# Patient Record
Sex: Female | Born: 1935 | Race: White | Hispanic: No | Smoking: Former smoker
Health system: Southern US, Community
[De-identification: ages and names within clinical notes are randomized; demographics above are authoritative.]

## PROBLEM LIST (undated history)

## (undated) HISTORY — PX: JOINT REPLACEMENT: SHX530

---

## 2011-06-21 DIAGNOSIS — K219 Gastro-esophageal reflux disease without esophagitis: Secondary | ICD-10-CM | POA: Insufficient documentation

## 2011-06-21 DIAGNOSIS — Z9049 Acquired absence of other specified parts of digestive tract: Secondary | ICD-10-CM | POA: Insufficient documentation

## 2011-06-21 DIAGNOSIS — E785 Hyperlipidemia, unspecified: Secondary | ICD-10-CM | POA: Insufficient documentation

## 2011-06-21 DIAGNOSIS — B9681 Helicobacter pylori [H. pylori] as the cause of diseases classified elsewhere: Secondary | ICD-10-CM | POA: Insufficient documentation

## 2011-06-21 DIAGNOSIS — D500 Iron deficiency anemia secondary to blood loss (chronic): Secondary | ICD-10-CM | POA: Insufficient documentation

## 2011-06-21 DIAGNOSIS — Z9889 Other specified postprocedural states: Secondary | ICD-10-CM | POA: Insufficient documentation

## 2011-10-18 DIAGNOSIS — Q792 Exomphalos: Secondary | ICD-10-CM | POA: Insufficient documentation

## 2012-08-15 DIAGNOSIS — I10 Essential (primary) hypertension: Secondary | ICD-10-CM | POA: Insufficient documentation

## 2014-06-17 ENCOUNTER — Ambulatory Visit: Payer: Self-pay | Admitting: Physical Therapy

## 2014-06-20 ENCOUNTER — Ambulatory Visit: Payer: Medicare Other | Attending: Physician Assistant | Admitting: Physical Therapy

## 2014-06-20 DIAGNOSIS — R293 Abnormal posture: Secondary | ICD-10-CM | POA: Diagnosis not present

## 2014-06-20 DIAGNOSIS — IMO0001 Reserved for inherently not codable concepts without codable children: Secondary | ICD-10-CM | POA: Diagnosis present

## 2014-06-20 DIAGNOSIS — Z96649 Presence of unspecified artificial hip joint: Secondary | ICD-10-CM | POA: Insufficient documentation

## 2014-06-20 DIAGNOSIS — M545 Low back pain, unspecified: Secondary | ICD-10-CM | POA: Diagnosis not present

## 2014-06-20 DIAGNOSIS — R5381 Other malaise: Secondary | ICD-10-CM | POA: Insufficient documentation

## 2014-06-24 ENCOUNTER — Ambulatory Visit: Payer: Medicare Other | Admitting: Physical Therapy

## 2014-06-24 DIAGNOSIS — IMO0001 Reserved for inherently not codable concepts without codable children: Secondary | ICD-10-CM | POA: Diagnosis not present

## 2014-06-26 ENCOUNTER — Ambulatory Visit: Payer: Medicare Other | Admitting: Physical Therapy

## 2014-06-26 DIAGNOSIS — IMO0001 Reserved for inherently not codable concepts without codable children: Secondary | ICD-10-CM | POA: Diagnosis not present

## 2014-07-01 ENCOUNTER — Ambulatory Visit: Payer: Medicare Other | Attending: Physician Assistant | Admitting: Physical Therapy

## 2014-07-01 DIAGNOSIS — R5381 Other malaise: Secondary | ICD-10-CM | POA: Insufficient documentation

## 2014-07-01 DIAGNOSIS — Z5189 Encounter for other specified aftercare: Secondary | ICD-10-CM | POA: Insufficient documentation

## 2014-07-01 DIAGNOSIS — M545 Low back pain: Secondary | ICD-10-CM | POA: Insufficient documentation

## 2014-07-01 DIAGNOSIS — R293 Abnormal posture: Secondary | ICD-10-CM | POA: Insufficient documentation

## 2014-07-01 DIAGNOSIS — Z96649 Presence of unspecified artificial hip joint: Secondary | ICD-10-CM | POA: Insufficient documentation

## 2014-07-09 ENCOUNTER — Ambulatory Visit: Payer: Medicare Other | Admitting: Physical Therapy

## 2014-07-09 DIAGNOSIS — Z96649 Presence of unspecified artificial hip joint: Secondary | ICD-10-CM | POA: Diagnosis not present

## 2014-07-09 DIAGNOSIS — Z5189 Encounter for other specified aftercare: Secondary | ICD-10-CM | POA: Diagnosis present

## 2014-07-09 DIAGNOSIS — R5381 Other malaise: Secondary | ICD-10-CM | POA: Diagnosis not present

## 2014-07-09 DIAGNOSIS — M545 Low back pain: Secondary | ICD-10-CM | POA: Diagnosis not present

## 2014-07-09 DIAGNOSIS — R293 Abnormal posture: Secondary | ICD-10-CM | POA: Diagnosis not present

## 2014-07-15 ENCOUNTER — Ambulatory Visit: Payer: Medicare Other | Admitting: Physical Therapy

## 2014-07-15 DIAGNOSIS — Z5189 Encounter for other specified aftercare: Secondary | ICD-10-CM | POA: Diagnosis not present

## 2014-07-22 ENCOUNTER — Ambulatory Visit: Payer: Medicare Other | Admitting: Physical Therapy

## 2014-07-22 DIAGNOSIS — Z5189 Encounter for other specified aftercare: Secondary | ICD-10-CM | POA: Diagnosis not present

## 2014-07-29 ENCOUNTER — Ambulatory Visit: Payer: Medicare Other | Attending: Physician Assistant | Admitting: Physical Therapy

## 2014-07-29 DIAGNOSIS — R5381 Other malaise: Secondary | ICD-10-CM | POA: Diagnosis not present

## 2014-07-29 DIAGNOSIS — Z96649 Presence of unspecified artificial hip joint: Secondary | ICD-10-CM | POA: Diagnosis not present

## 2014-07-29 DIAGNOSIS — Z5189 Encounter for other specified aftercare: Secondary | ICD-10-CM | POA: Diagnosis present

## 2014-07-29 DIAGNOSIS — M545 Low back pain: Secondary | ICD-10-CM | POA: Diagnosis not present

## 2014-07-29 DIAGNOSIS — R293 Abnormal posture: Secondary | ICD-10-CM | POA: Insufficient documentation

## 2014-08-05 ENCOUNTER — Encounter: Payer: Private Health Insurance - Indemnity | Admitting: Physical Therapy

## 2015-01-21 DIAGNOSIS — L03116 Cellulitis of left lower limb: Secondary | ICD-10-CM | POA: Insufficient documentation

## 2015-01-21 DIAGNOSIS — F418 Other specified anxiety disorders: Secondary | ICD-10-CM | POA: Insufficient documentation

## 2015-01-21 DIAGNOSIS — R600 Localized edema: Secondary | ICD-10-CM | POA: Insufficient documentation

## 2016-10-04 DIAGNOSIS — N184 Chronic kidney disease, stage 4 (severe): Secondary | ICD-10-CM | POA: Insufficient documentation

## 2016-10-11 DIAGNOSIS — S22000A Wedge compression fracture of unspecified thoracic vertebra, initial encounter for closed fracture: Secondary | ICD-10-CM | POA: Insufficient documentation

## 2017-12-08 ENCOUNTER — Other Ambulatory Visit: Payer: Self-pay

## 2017-12-08 ENCOUNTER — Emergency Department (HOSPITAL_COMMUNITY): Payer: Medicare Other

## 2017-12-08 ENCOUNTER — Encounter (HOSPITAL_COMMUNITY): Payer: Self-pay

## 2017-12-08 ENCOUNTER — Emergency Department (HOSPITAL_COMMUNITY)
Admission: EM | Admit: 2017-12-08 | Discharge: 2017-12-08 | Disposition: A | Discharge status: Home / Self Care | Payer: Medicare Other | Attending: Emergency Medicine | Admitting: Emergency Medicine

## 2017-12-08 DIAGNOSIS — R079 Chest pain, unspecified: Secondary | ICD-10-CM | POA: Diagnosis present

## 2017-12-08 LAB — CBC WITH DIFFERENTIAL/PLATELET
Basophils Absolute: 0 10*3/uL (ref 0.0–0.1)
Basophils Relative: 0 %
EOS PCT: 2 %
Eosinophils Absolute: 0.1 10*3/uL (ref 0.0–0.7)
HEMATOCRIT: 41 % (ref 36.0–46.0)
Hemoglobin: 13.3 g/dL (ref 12.0–15.0)
LYMPHS ABS: 1.1 10*3/uL (ref 0.7–4.0)
LYMPHS PCT: 18 %
MCH: 31.8 pg (ref 26.0–34.0)
MCHC: 32.4 g/dL (ref 30.0–36.0)
MCV: 98.1 fL (ref 78.0–100.0)
MONO ABS: 0.4 10*3/uL (ref 0.1–1.0)
MONOS PCT: 7 %
NEUTROS ABS: 4.8 10*3/uL (ref 1.7–7.7)
Neutrophils Relative %: 73 %
PLATELETS: 219 10*3/uL (ref 150–400)
RBC: 4.18 MIL/uL (ref 3.87–5.11)
RDW: 13.3 % (ref 11.5–15.5)
WBC: 6.5 10*3/uL (ref 4.0–10.5)

## 2017-12-08 LAB — BASIC METABOLIC PANEL
ANION GAP: 13 (ref 5–15)
BUN: 22 mg/dL — AB (ref 6–20)
CALCIUM: 9.7 mg/dL (ref 8.9–10.3)
CO2: 28 mmol/L (ref 22–32)
CREATININE: 1.34 mg/dL — AB (ref 0.44–1.00)
Chloride: 97 mmol/L — ABNORMAL LOW (ref 101–111)
GFR calc Af Amer: 41 mL/min — ABNORMAL LOW (ref >60)
GFR calc non Af Amer: 35 mL/min — ABNORMAL LOW (ref >60)
GLUCOSE: 97 mg/dL (ref 65–99)
Potassium: 4.2 mmol/L (ref 3.5–5.1)
Sodium: 138 mmol/L (ref 135–145)

## 2017-12-08 LAB — I-STAT TROPONIN, ED: Troponin i, poc: 0 ng/mL (ref 0.00–0.08)

## 2017-12-08 NOTE — ED Provider Notes (Signed)
[facility] Provider Note   CSN: [id number] Arrival date & time: 12/08/17  1007     History   Chief Complaint Chief Complaint  Patient presents with  . Chest Pain     HPI   Blood pressure (!) 166/88, pulse 82, temperature 98.1 F (36.7 C), temperature source Oral, resp. rate 20, height 5\' 3"  (1.6 m), weight 61.2 kg (135 lb), SpO2 97 %.  Mallory Ross is a 84 y.o. female caught in by EMS for resolved chest pain.  Patient states she was out to breakfast and she felt a severe retrosternal chest pain, nonradiating that she describes as pressure-like with no associated diaphoresis, shortness of breath.  There was some mild associated lightheadedness.  The pain lasted approximately 10 minutes.  EMS was called, she was given full dose aspirin.  She states that the pain lessens slowly over the course of time, triage note states that she belched and the pain went away completely however patient states that the belching did not completely alleviate the pain.  She has no prior cardiac history.  She denies history of smoking, diabetes, hyperlipidemia but she does have hypertension.  Patient asymptomatic at this time.  No past medical history on file.  There are no active problems to display for this patient.    OB History    No data available       Home Medications    Prior to Admission medications   Not on File    Family History No family history on file.  Social History Social History   Tobacco Use  . Smoking status: Not on file  Substance Use Topics  . Alcohol use: Not on file  . Drug use: Not on file     Allergies   Patient has no allergy information on record.   Review of Systems Review of Systems  A complete review of systems was obtained and all systems are negative except as noted in the HPI and PMH.   Physical Exam Updated Vital Signs BP 92/73   Pulse 90   Temp 98.1 F (36.7 C) (Oral)   Resp 20   Ht 5\' 3"  (1.6  m)   Wt 61.2 kg (135 lb)   SpO2 91%   BMI 23.91 kg/m   Physical Exam  Constitutional: She is oriented to person, place, and time. She appears well-developed and well-nourished. No distress.  HENT:  Head: Normocephalic.  Mouth/Throat: Oropharynx is clear and moist.  Eyes: Conjunctivae are normal.  Neck: Normal range of motion. No JVD present. No tracheal deviation present.  Cardiovascular: Normal rate, regular rhythm and intact distal pulses.  Radial pulse equal bilaterally  Pulmonary/Chest: Effort normal and breath sounds normal. No stridor. No respiratory distress. She has no wheezes. She has no rales. She exhibits no tenderness.  Abdominal: Soft. She exhibits no distension and no mass. There is no tenderness. There is no rebound and no guarding.  Musculoskeletal: Normal range of motion. She exhibits no edema or tenderness.  No calf asymmetry, superficial collaterals, palpable cords, edema, Homans sign negative bilaterally.    Neurological: She is alert and oriented to person, place, and time.  Skin: Skin is warm. She is not diaphoretic.  Psychiatric: She has a normal mood and affect.  Nursing note and vitals reviewed.    ED Treatments / Results  Labs (all labs ordered are listed, but only abnormal results are displayed) Labs Reviewed  BASIC METABOLIC PANEL - Abnormal; Notable for the following components:  Result Value   Chloride 97 (*)    BUN 22 (*)    Creatinine, Ser 1.34 (*)    GFR calc non Af Amer 35 (*)    GFR calc Af Amer 41 (*)    All other components within normal limits  CBC WITH DIFFERENTIAL/PLATELET  I-STAT TROPONIN, ED    EKG  EKG Interpretation  Date/Time:  Thursday December 08 2017 10:09:23 EDT Ventricular Rate:  79 PR Interval:    QRS Duration: 95 QT Interval:  393 QTC Calculation: 451 R Axis:   -53 Text Interpretation:  Sinus rhythm Atrial premature complex Left anterior fascicular block Abnormal R-wave progression, late transition Left  ventricular hypertrophy No old tracing to compare Confirmed by Raeford Razor [phone]) on 12/08/2017 10:49:32 AM       Radiology Dg Chest 2 View  Result Date: 12/08/2017 CLINICAL DATA:  Central chest pain EXAM: CHEST - 2 VIEW COMPARISON:  None. FINDINGS: Cardiac shadow is within normal limits. Aortic calcifications are noted. Changes of multilevel vertebral augmentation are seen in the thoracic spine. No acute bony abnormality is seen. No focal infiltrate is noted. IMPRESSION: No acute abnormality noted. Electronically Signed   By: Alcide Clever M.D.   On: 12/08/2017 11:13    Procedures Procedures (including critical care time)  Medications Ordered in ED Medications - No data to display   Initial Impression / Assessment and Plan / ED Course  I have reviewed the triage vital signs and the nursing notes.  Pertinent labs & imaging results that were available during my care of the patient were reviewed by me and considered in my medical decision making (see chart for details).     Vitals:   12/08/17 1115 12/08/17 1130 12/08/17 1145 12/08/17 1200  BP: (!) 153/94 (!) 163/85 (!) 146/84 92/73  Pulse: (!) 103 84 76 90  Resp: 13 15 15 20   Temp:      TempSrc:      SpO2: 94% 96% 93% 91%  Weight:      Height:        Mallory Ross is 84 y.o. female presenting with 10 minutes of retrosternal chest pain with no other associated symptoms onset this morning.  Pain would be atypical however given the history of hypertension and her age, will observe in the ED and check blood work, EKG and chest x-ray.  Workup reassuring.  Patient has remained chest pain-free while in the ED, given the atypical nature of this pain I think she is appropriate for discharge with outpatient care.  Attending physician has evaluated this patient and agrees with stability for discharge.  Patient advised to follow closely with primary care.  Evaluation does not show pathology that would require ongoing emergent  intervention or inpatient treatment. Pt is hemodynamically stable and mentating appropriately. Discussed findings and plan with patient/guardian, who agrees with care plan. All questions answered. Return precautions discussed and outpatient follow up given.      Final Clinical Impressions(s) / ED Diagnoses   Final diagnoses:  Chest pain, unspecified type    ED Discharge Orders    None       Lynetta Mare Mardella Layman 12/08/17 1243    Raeford Razor, MD 12/08/17 1515

## 2017-12-08 NOTE — ED Triage Notes (Signed)
Patient was at morning breakfast and experienced a sharp left sided non-radiating chest pain. During transport patient belched and all pain went away. EMS gave patient 324 ASA.

## 2017-12-08 NOTE — ED Notes (Signed)
Patient returned from XRay

## 2017-12-08 NOTE — Discharge Instructions (Signed)
Please follow with your primary care doctor in the next 2 days for a check-up. They must obtain records for further management.  ° °Do not hesitate to return to the Emergency Department for any new, worsening or concerning symptoms.  ° °

## 2018-04-27 DIAGNOSIS — M2011 Hallux valgus (acquired), right foot: Secondary | ICD-10-CM | POA: Insufficient documentation

## 2018-04-27 DIAGNOSIS — M2041 Other hammer toe(s) (acquired), right foot: Secondary | ICD-10-CM | POA: Insufficient documentation

## 2018-07-21 IMAGING — CR DG CHEST 2V
2 series · 2 of 2 positions shown · non-contrast
Comparison: None.

CLINICAL DATA: Central chest pain

EXAM:
CHEST - 2 VIEW

[chest lat]
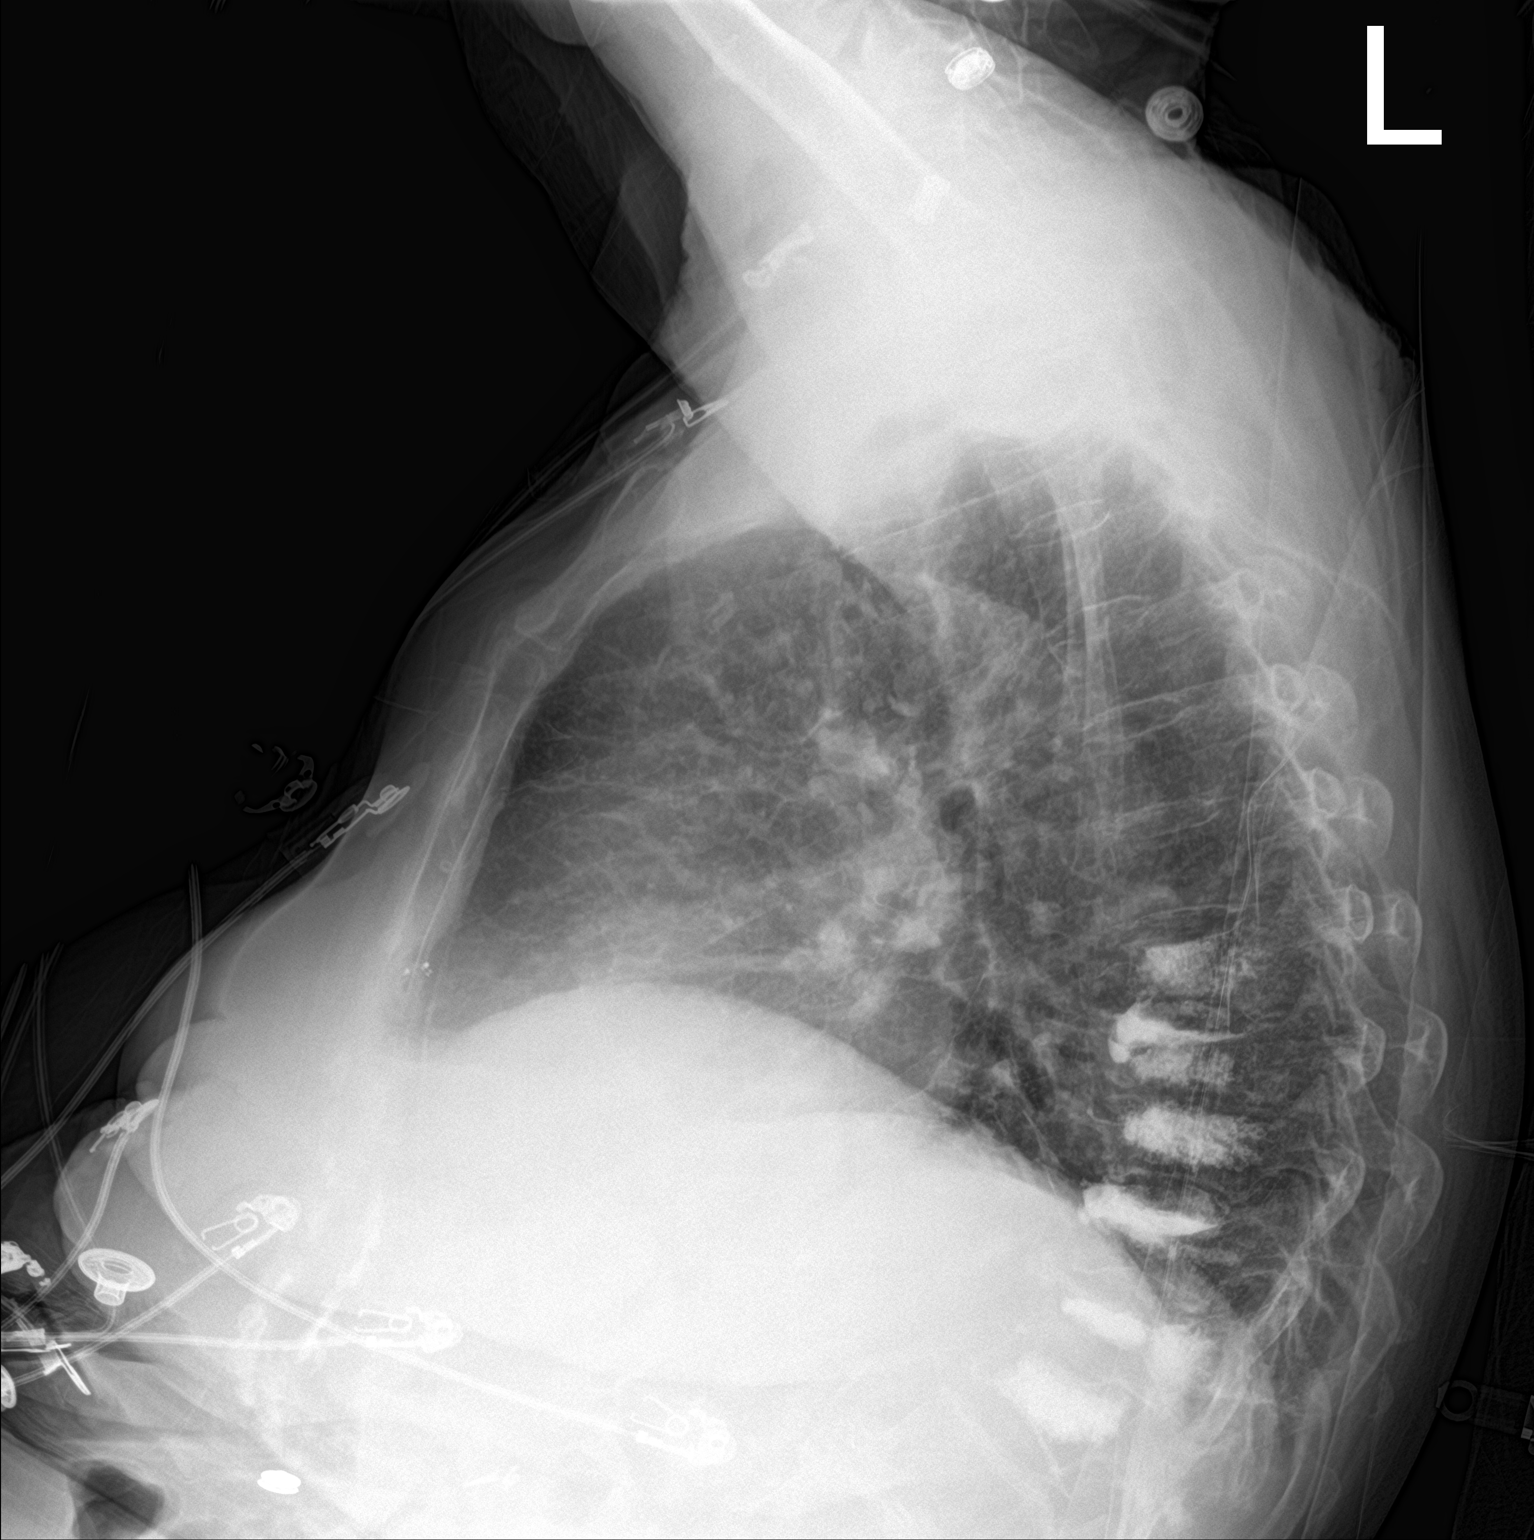

[chest ap]
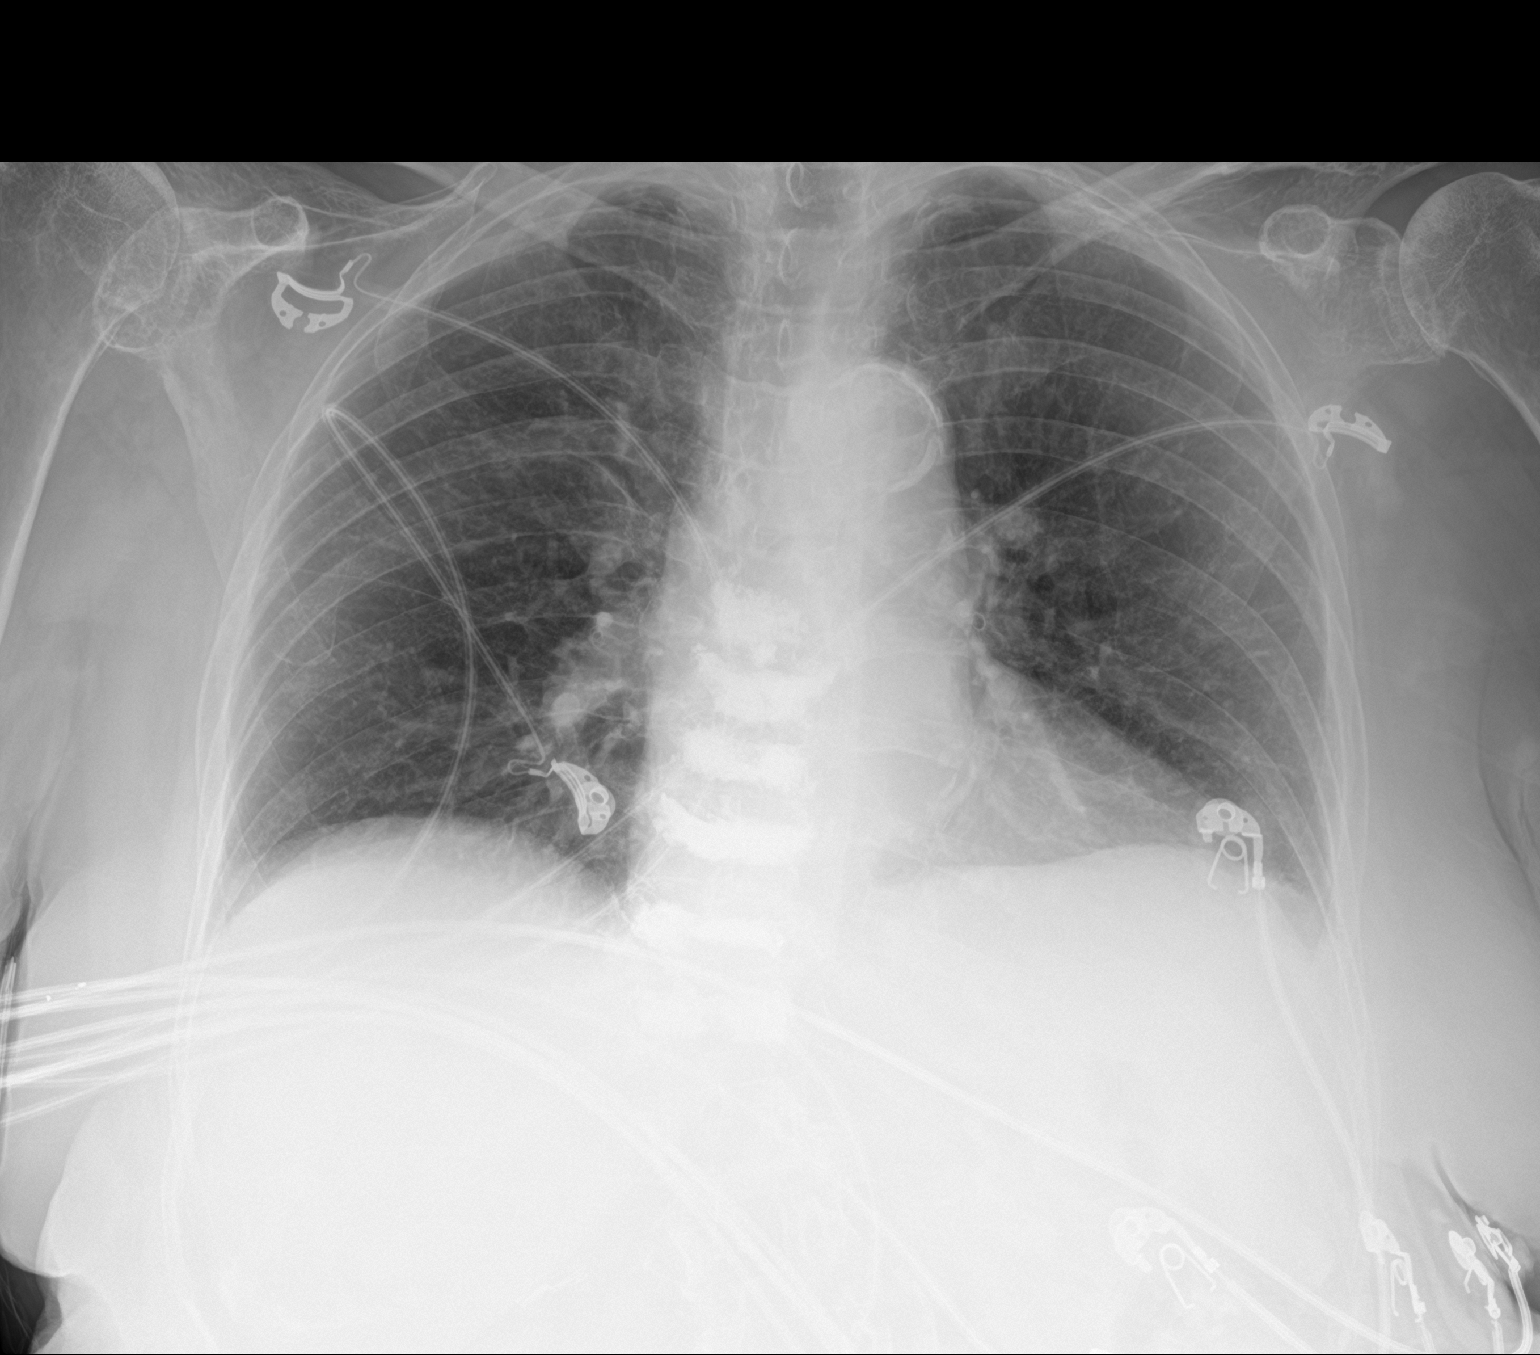

[2 of 2 positions shown; findings below may reference images not displayed]

FINDINGS: Cardiac shadow is within normal limits. Aortic calcifications are
noted. Changes of multilevel vertebral augmentation are seen in the
thoracic spine. No acute bony abnormality is seen. No focal
infiltrate is noted.
IMPRESSION: No acute abnormality noted.

## 2018-10-05 DIAGNOSIS — K220 Achalasia of cardia: Secondary | ICD-10-CM | POA: Insufficient documentation

## 2018-10-05 DIAGNOSIS — M19011 Primary osteoarthritis, right shoulder: Secondary | ICD-10-CM | POA: Insufficient documentation

## 2019-06-14 DIAGNOSIS — D0512 Intraductal carcinoma in situ of left breast: Secondary | ICD-10-CM | POA: Insufficient documentation

## 2019-11-22 ENCOUNTER — Ambulatory Visit: Payer: Medicare Other | Attending: Internal Medicine

## 2019-11-22 DIAGNOSIS — Z23 Encounter for immunization: Secondary | ICD-10-CM

## 2019-11-22 NOTE — Progress Notes (Signed)
   Covid-19 Vaccination Clinic  Name:  Kazaria Gaertner    MRN: [id number] DOB: 10-Sep-1930  11/22/2019  Ms. Blossom was observed post Covid-19 immunization for 15 minutes without incidence. She was provided with Vaccine Information Sheet and instruction to access the V-Safe system.   Ms. Kuklinski was instructed to call 911 with any severe reactions post vaccine: Marland Kitchen Difficulty breathing  . Swelling of your face and throat  . A fast heartbeat  . A bad rash all over your body  . Dizziness and weakness    Immunizations Administered    Name Date Dose VIS Date Route   Pfizer COVID-19 Vaccine 11/22/2019 12:15 PM 0.3 mL 09/07/2019 Intramuscular   Manufacturer: [employer], [employer]   Lot: [id number]   NDC: [address]-3

## 2019-12-12 ENCOUNTER — Ambulatory Visit: Payer: Medicare Other | Attending: Internal Medicine

## 2019-12-12 DIAGNOSIS — Z23 Encounter for immunization: Secondary | ICD-10-CM

## 2019-12-12 NOTE — Progress Notes (Signed)
   Covid-19 Vaccination Clinic  Name:  Mallory Ross    MRN: [id number] DOB: [birth year]  12/12/2019  Mallory Ross was observed post Covid-19 immunization for 15 minutes without incident. She was provided with Vaccine Information Sheet and instruction to access the V-Safe system.   Mallory Ross was instructed to call 911 with any severe reactions post vaccine: Marland Kitchen Difficulty breathing  . Swelling of face and throat  . A fast heartbeat  . A bad rash all over body  . Dizziness and weakness   Immunizations Administered    Name Date Dose VIS Date Route   Pfizer COVID-19 Vaccine 12/12/2019 12:53 PM 0.3 mL 09/07/2019 Intramuscular   Manufacturer: [employer], [employer]   Lot: [id number]   NDC: [address]-2

## 2020-02-15 ENCOUNTER — Ambulatory Visit: Payer: Medicare Other | Admitting: Podiatry

## 2020-02-18 ENCOUNTER — Ambulatory Visit: Payer: Self-pay | Admitting: Podiatrist

## 2020-02-29 ENCOUNTER — Other Ambulatory Visit: Payer: Self-pay

## 2020-02-29 ENCOUNTER — Encounter: Payer: Self-pay | Admitting: Podiatry

## 2020-02-29 ENCOUNTER — Telehealth: Payer: Self-pay | Admitting: *Deleted

## 2020-02-29 ENCOUNTER — Ambulatory Visit (INDEPENDENT_AMBULATORY_CARE_PROVIDER_SITE_OTHER): Payer: Medicare Other | Admitting: Podiatry

## 2020-02-29 VITALS — Temp 98.3°F

## 2020-02-29 DIAGNOSIS — M79674 Pain in right toe(s): Secondary | ICD-10-CM

## 2020-02-29 DIAGNOSIS — L603 Nail dystrophy: Secondary | ICD-10-CM

## 2020-02-29 DIAGNOSIS — R0989 Other specified symptoms and signs involving the circulatory and respiratory systems: Secondary | ICD-10-CM

## 2020-02-29 DIAGNOSIS — R200 Anesthesia of skin: Secondary | ICD-10-CM

## 2020-02-29 DIAGNOSIS — I739 Peripheral vascular disease, unspecified: Secondary | ICD-10-CM | POA: Insufficient documentation

## 2020-02-29 DIAGNOSIS — M79675 Pain in left toe(s): Secondary | ICD-10-CM

## 2020-02-29 DIAGNOSIS — M48061 Spinal stenosis, lumbar region without neurogenic claudication: Secondary | ICD-10-CM | POA: Insufficient documentation

## 2020-02-29 NOTE — Telephone Encounter (Signed)
-----   Message from Vivi Barrack, DPM sent at 02/29/2020  2:38 PM EDT ----- Can you please order arterial studies? Thanks.

## 2020-02-29 NOTE — Progress Notes (Signed)
Subjective:   Patient ID: Mallory Ross, female   DOB: 86 y.o.   MRN: [id number]   HPI 86 year old female with concerns of her left big toenail coming thickened discolored for the last 3 to 4 months and she states that it "looks rotten".  No drainage or pus coming from the area and currently no redness or swelling.  There is occasionally uncomfortable with pressure but no significant pain.  Review of Systems  All other systems reviewed and are negative.  History reviewed. No pertinent past medical history.  Past Surgical History:  Procedure Laterality Date   JOINT REPLACEMENT       Current Outpatient Medications:    ALPRAZolam (XANAX) 0.5 MG tablet, Take by mouth., Disp: , Rfl:    buPROPion (WELLBUTRIN XL) 150 MG 24 hr tablet, TAKE 1 TABLET BY MOUTH IN THE MORNING, Disp: , Rfl:    Calcium Carb-Cholecalciferol (CALCIUM CARBONATE-VITAMIN D3 PO), Take by mouth., Disp: , Rfl:    colchicine 0.6 MG tablet, Take 2 and repeat 1 in 1 hour., Disp: , Rfl:    Cyanocobalamin (VITAMIN B-12 PO), Take by mouth., Disp: , Rfl:    denosumab (PROLIA) 60 MG/ML SOSY injection, Inject into the skin., Disp: , Rfl:    diltiazem (TIAZAC) 180 MG 24 hr capsule, Take by mouth., Disp: , Rfl:    fexofenadine (ALLEGRA) 180 MG tablet, Take by mouth., Disp: , Rfl:    furosemide (LASIX) 80 MG tablet, Take by mouth., Disp: , Rfl:    mirtazapine (REMERON) 15 MG tablet, TAKE 1 TABLET BY MOUTH AT BEDTIME, Disp: , Rfl:    Multiple Vitamins-Minerals (CENTRUM SILVER PO), Take by mouth., Disp: , Rfl:    pantoprazole (PROTONIX) 40 MG tablet, Take 1 tablet by mouth once daily, Disp: , Rfl:    Pediatric Multivitamins-Iron (FLINTSTONES PLUS IRON PO), Take by mouth., Disp: , Rfl:    pravastatin (PRAVACHOL) 40 MG tablet, Take 1 tablet by mouth once daily, Disp: , Rfl:   Allergies  Allergen Reactions   Morphine And Related Other (See Comments)    CRAZY CRAZY    Shellfish-Derived Products Nausea And Vomiting    OYSTERS         Objective:  Physical Exam  General: AAO x3, NAD  Dermatological: Nails appear to be hypertrophic, dystrophic, discolored with yellow discoloration particular the LEFT hallux toenail is very thick and discolored.  There is no edema, erythema or any signs of infection noted today.  Vascular: DP pulses palpable, PT pulse decreased  Neruologic: Grossly intact via light touch bilateral.  Sensation intact with Semmes Weinstein monofilament.  She does describe numbness to her toes however sensation appears to be intact. No burning.   Musculoskeletal: No gross boney pedal deformities bilateral. No pain, crepitus, or limitation noted with foot and ankle range of motion bilateral. Muscular strength 5/5 in all groups tested bilateral.  Gait: Uses walker     Assessment:   Onychodystrophy, onychomycosis     Plan:  -Treatment options discussed including all alternatives, risks, and complications -Etiology of symptoms were discussed -Discussed nail removal of hallux toenail however her history does not show peripheral vascular disease and she is recently had 2 ulcerations that taken 3 months to heal in the left leg and she was just released to the wound care center.  Because of this and has not remove the toenail causing her wound today I would order arterial studies to be performed prior to this.  As a courtesy I debrided the nails x10  with any complications or bleeding.  Follow-up after circulation studies or sooner if any issues are to arise.  Offered routine debridement services for her.  Vivi Barrack DPM

## 2020-02-29 NOTE — Telephone Encounter (Deleted)
-----   Message from Matthew R Wagoner, DPM sent at 02/29/2020  2:49 PM EDT ----- In regards to the previous message about her circulation test they need to call her daughter Becky for the appointment and her number is [phone]  

## 2020-02-29 NOTE — Patient Instructions (Signed)
Pre-Operative Instructions  Congratulations, you have decided to take an important step towards improving your quality of life.  You can be assured that the doctors and staff at [facility] will be with you every step of the way.  Here are some important things you should know:  1. Plan to be at the surgery center/hospital at least 1 (one) hour prior to your scheduled time, unless otherwise directed by the surgical center/hospital staff.  You must have a responsible adult accompany you, remain during the surgery and drive you home.  Make sure you have directions to the surgical center/hospital to ensure you arrive on time. 2. If you are having surgery at [facility] or [facility] hospitals, you will need a copy of your medical history and physical form from your family physician within one month prior to the date of surgery. We will give you a form for your primary physician to complete.  3. We make every effort to accommodate the date you request for surgery.  However, there are times where surgery dates or times have to be moved.  We will contact you as soon as possible if a change in schedule is required.   4. No aspirin/ibuprofen for one week before surgery.  If you are on aspirin, any non-steroidal anti-inflammatory medications (Mobic, Aleve, Ibuprofen) should not be taken seven (7) days prior to your surgery.  You make take Tylenol for pain prior to surgery.  5. Medications - If you are taking daily heart and blood pressure medications, seizure, reflux, allergy, asthma, anxiety, pain or diabetes medications, make sure you notify the surgery center/hospital before the day of surgery so they can tell you which medications you should take or avoid the day of surgery. 6. No food or drink after midnight the night before surgery unless directed otherwise by surgical center/hospital staff. 7. No alcoholic beverages 24-hours prior to surgery.  No smoking 24-hours prior or 24-hours after  surgery. 8. Wear loose pants or shorts. They should be loose enough to fit over bandages, boots, and casts. 9. Don't wear slip-on shoes. Sneakers are preferred. 10. Bring your boot with you to the surgery center/hospital.  Also bring crutches or a walker if your physician has prescribed it for you.  If you do not have this equipment, it will be provided for you after surgery. 11. If you have not been contacted by the surgery center/hospital by the day before your surgery, call to confirm the date and time of your surgery. 12. Leave-time from work may vary depending on the type of surgery you have.  Appropriate arrangements should be made prior to surgery with your employer. 13. Prescriptions will be provided immediately following surgery by your doctor.  Fill these as soon as possible after surgery and take the medication as directed. Pain medications will not be refilled on weekends and must be approved by the doctor. 14. Remove nail polish on the operative foot and avoid getting pedicures prior to surgery. 15. Wash the night before surgery.  The night before surgery wash the foot and leg well with water and the antibacterial soap provided. Be sure to pay special attention to beneath the toenails and in between the toes.  Wash for at least three (3) minutes. Rinse thoroughly with water and dry well with a towel.  Perform this wash unless told not to do so by your physician.  Enclosed: 1 Ice pack (please put in freezer the night before surgery)   1 Hibiclens skin cleaner     Pre-op instructions  If you have any questions regarding the instructions, please do not hesitate to call our office.  [place]: [address], [place], [place] [address] -- [phone]  [place]: [address] [place], [place] [address] -- [phone]  [place]: [address], [place], [place] [address] -- [phone]   Website: [email or web address] 

## 2020-03-03 NOTE — Telephone Encounter (Signed)
Left message informing pt's Dtr, Michaelle Birks [facility] in [place] was scheduling into September and I would be scheduling her mtr in [place] with [facility] on [address] in [facility]. Faxed orders to [facility].

## 2020-03-03 NOTE — Telephone Encounter (Signed)
-----   Message from Vivi Barrack, DPM sent at 02/29/2020  2:49 PM EDT ----- In regards to the previous message about her circulation test they need to call her daughter Kriste Basque for the appointment and her number is [phone]

## 2020-03-27 ENCOUNTER — Other Ambulatory Visit: Payer: Self-pay

## 2020-03-27 ENCOUNTER — Ambulatory Visit (HOSPITAL_COMMUNITY)
Admission: RE | Admit: 2020-03-27 | Discharge: 2020-03-27 | Disposition: A | Discharge status: Home / Self Care | Payer: Medicare Other | Source: Ambulatory Visit | Attending: Cardiology | Admitting: Cardiology

## 2020-03-27 DIAGNOSIS — M79675 Pain in left toe(s): Secondary | ICD-10-CM | POA: Insufficient documentation

## 2020-03-27 DIAGNOSIS — R0989 Other specified symptoms and signs involving the circulatory and respiratory systems: Secondary | ICD-10-CM | POA: Diagnosis present

## 2020-03-27 DIAGNOSIS — M79674 Pain in right toe(s): Secondary | ICD-10-CM | POA: Diagnosis present

## 2020-04-18 ENCOUNTER — Ambulatory Visit: Payer: Medicare Other | Admitting: Podiatry

## 2020-04-25 ENCOUNTER — Other Ambulatory Visit: Payer: Self-pay

## 2020-04-25 ENCOUNTER — Encounter: Payer: Self-pay | Admitting: Podiatry

## 2020-04-25 ENCOUNTER — Ambulatory Visit (INDEPENDENT_AMBULATORY_CARE_PROVIDER_SITE_OTHER): Payer: Medicare Other | Admitting: Podiatry

## 2020-04-25 VITALS — Temp 98.0°F

## 2020-04-25 DIAGNOSIS — M79675 Pain in left toe(s): Secondary | ICD-10-CM | POA: Diagnosis not present

## 2020-04-25 DIAGNOSIS — L603 Nail dystrophy: Secondary | ICD-10-CM | POA: Diagnosis not present

## 2020-04-25 MED ORDER — NEOMYCIN-POLYMYXIN-HC 3.5-10000-1 OT SOLN: OTIC | 0 refills | Status: AC

## 2020-04-25 NOTE — Patient Instructions (Signed)

## 2020-04-25 NOTE — Progress Notes (Signed)
Subjective: 86 year old female presents the office today with her daughter for concerns of her left big toenail still causing discomfort and is thick and she wants to have the nail removed.  She has no other ulcerations the wound still healing her left leg.  She also had arterial studies performed. Denies any systemic complaints such as fevers, chills, nausea, vomiting. No acute changes since last appointment, and no other complaints at this time.   Objective: AAO x3, NAD DP/PT pulses palpable bilaterally, CRT less than 3 seconds Left hallux toenail significantly hypertrophic, dystrophic with yellow discoloration and there is tenderness to the entire toenail.  There is no surrounding erythema, ascending cellulitis there is no fluctuation crepitation.  No open lesions. No pain with calf compression, swelling, warmth, erythema  Arterial Studies 03/27/2020  Summary:  Right: Resting right ankle-brachial index is within normal range. No  evidence of significant right lower extremity arterial disease. The right  toe-brachial index is normal.   Left: Resting left ankle-brachial index is within normal range. No  evidence of significant left lower extremity arterial disease. The left  toe-brachial index is normal.   Assessment: Left hallux onychodystrophy, pain  Plan: -All treatment options discussed with the patient including all alternatives, risks, complications.  -At this time, the patient is requesting total nail removal with chemical matricectomy to the symptomatic portion of the nail. Risks and complications were discussed with the patient for which they understand and written consent was obtained. Under sterile conditions a total of 3 mL of a mixture of 2% lidocaine plain and 0.5% Marcaine plain was infiltrated in a hallux block fashion. Once anesthetized, the skin was prepped in sterile fashion. A tourniquet was then applied. Next the left  hallux nail border was then excised making sure to  remove the entire offending nail border. Once the nails was removed area was debrided and the underlying skin was intact. There is no purulence identified in the procedure. Next phenol was then applied under standard conditions and copiously irrigated. Silvadene was applied. A dry sterile dressing was applied. After application of the dressing the tourniquet was removed and there is found to be an immediate capillary refill time to the digit. The patient tolerated the procedure well any complications. Post procedure instructions were discussed the patient for which he verbally understood. Follow-up in one week for nail check or sooner if any problems are to arise. Discussed signs/symptoms of infection and directed to call the office immediately should any occur or go directly to the emergency room. In the meantime, encouraged to call the office with any questions, concerns, changes symptoms. -Patient encouraged to call the office with any questions, concerns, change in symptoms.   RTC 1 week for nail check   Vivi Barrack DPM

## 2020-05-02 ENCOUNTER — Ambulatory Visit (INDEPENDENT_AMBULATORY_CARE_PROVIDER_SITE_OTHER): Payer: Medicare Other | Admitting: Podiatry

## 2020-05-02 ENCOUNTER — Other Ambulatory Visit: Payer: Self-pay

## 2020-05-02 ENCOUNTER — Encounter: Payer: Self-pay | Admitting: Podiatry

## 2020-05-02 VITALS — Temp 97.8°F

## 2020-05-02 DIAGNOSIS — M79675 Pain in left toe(s): Secondary | ICD-10-CM

## 2020-05-02 DIAGNOSIS — B351 Tinea unguium: Secondary | ICD-10-CM

## 2020-05-02 DIAGNOSIS — M79674 Pain in right toe(s): Secondary | ICD-10-CM | POA: Diagnosis not present

## 2020-05-02 NOTE — Progress Notes (Signed)
Subjective: 86 year old female presents the office today for follow evaluation after undergoing left total nail avulsion.  She states the area is doing well and is healing.  She is been soaking Epson salts covering with antibiotic ointment and a bandage today.  Denies any drainage or pus or any pain.  No swelling or redness.  She is asked her of the nails be trimmed today's are thick and she cannot do them herself. Denies any systemic complaints such as fevers, chills, nausea, vomiting. No acute changes since last appointment, and no other complaints at this time.   Objective: AAO x3, NAD DP/PT pulses palpable bilaterally, CRT less than 3 seconds Status post left total nail avulsion and there is agreement wound base and small mild scab present.  There is no surrounding erythema, ascending cellulitis.  There is no drainage or pus or any signs of infection.  The remaining nails appear to be mildly hypertrophic, dystrophic and there is tenderness nails 2 through 5 on the left and 1 through 5 on the right. No pain with calf compression, swelling, warmth, erythema  Assessment: Status post total nail avulsion which is healing, symptomatic onychomycosis  Plan: -All treatment options discussed with the patient including all alternatives, risks, complications.  -I also appear to be doing well.  Continue soaking Epson salts twice a day covering with antibiotic ointment and a bandage of the day but can leave the area open at night.  Monitoring signs or symptoms of infection if not healed in 2 weeks to let me know. -Given the nails x9 without complications or bleeding -Patient encouraged to call the office with any questions, concerns, change in symptoms.   Vivi Barrack DPM

## 2020-05-02 NOTE — Patient Instructions (Signed)

## 2020-05-26 ENCOUNTER — Telehealth: Payer: Self-pay | Admitting: *Deleted

## 2020-05-26 ENCOUNTER — Telehealth (INDEPENDENT_AMBULATORY_CARE_PROVIDER_SITE_OTHER): Payer: Medicare Other | Admitting: Podiatry

## 2020-05-26 NOTE — Telephone Encounter (Signed)
Attempted to call patient. No answer. Hadley Pen, CMA left VM to call back

## 2020-05-26 NOTE — Telephone Encounter (Signed)
[facility] Office: Daughter Michaelle Birks called and LVM. Patient's toenail removed on Aug 6th. Area still has not scabbed over. Call back number [phone].

## 2020-05-26 NOTE — Telephone Encounter (Signed)
Patient's daughter called earlier and I was returning the patient's call and the mail box is full and can not accept messages. Misty Stanley

## 2020-05-29 ENCOUNTER — Telehealth: Payer: Self-pay | Admitting: *Deleted

## 2020-05-29 NOTE — Telephone Encounter (Signed)
Called and spoke with the patient's daughter Mallory Ross and patient's daughter stated that the was not red and does not drain any more and has been soaking the toe twice a day and I stated to use the neosporin but just a little bit and to leave the bandage off at night and that I would ask Dr Ardelle Anton about the soaking and patient's daughter did state that patient did hit the toe the other day and bled some and was really about the same. Misty Stanley

## 2020-11-26 ENCOUNTER — Ambulatory Visit: Payer: Self-pay | Admitting: *Deleted
